# Patient Record
Sex: Female | Born: 1955
Health system: Southern US, Community
[De-identification: ages and names within clinical notes are randomized; demographics above are authoritative.]

---

## 2000-05-09 ENCOUNTER — Encounter: Payer: Self-pay | Admitting: Internal Medicine

## 2000-05-09 ENCOUNTER — Encounter: Admission: RE | Admit: 2000-05-09 | Discharge: 2000-05-09 | Payer: Self-pay | Admitting: Internal Medicine

## 2003-12-09 ENCOUNTER — Other Ambulatory Visit: Admission: RE | Admit: 2003-12-09 | Discharge: 2003-12-09 | Payer: Self-pay | Admitting: Dermatology

## 2005-08-01 ENCOUNTER — Ambulatory Visit: Payer: Self-pay | Admitting: Internal Medicine

## 2005-08-22 ENCOUNTER — Ambulatory Visit: Payer: Self-pay | Admitting: Internal Medicine

## 2005-08-22 LAB — CONVERTED CEMR LAB
ALT: 15 units/L
AST: 19 units/L
Albumin: 4.5 g/dL
Alkaline Phosphatase: 96 units/L
Bilirubin, Direct: 0.2 mg/dL
Indirect Bilirubin: 0.2 mg/dL
Total Bilirubin: 0.4 mg/dL
Total Protein: 7.8 g/dL

## 2005-08-23 LAB — CONVERTED CEMR LAB: Pap Smear: NORMAL

## 2005-09-11 ENCOUNTER — Ambulatory Visit: Payer: Self-pay | Admitting: Internal Medicine

## 2005-09-11 LAB — CONVERTED CEMR LAB
Cholesterol: 229 mg/dL
HDL: 47 mg/dL
LDL Cholesterol: 126 mg/dL
Total CHOL/HDL Ratio: 4.9
Triglycerides: 281 mg/dL
VLDL: 56 mg/dL

## 2006-03-08 ENCOUNTER — Ambulatory Visit: Payer: Self-pay | Admitting: Internal Medicine

## 2006-04-19 ENCOUNTER — Ambulatory Visit: Payer: Self-pay | Admitting: Internal Medicine

## 2006-05-28 ENCOUNTER — Encounter: Payer: Self-pay | Admitting: Internal Medicine

## 2006-05-28 DIAGNOSIS — N952 Postmenopausal atrophic vaginitis: Secondary | ICD-10-CM | POA: Insufficient documentation

## 2006-05-28 DIAGNOSIS — M1990 Unspecified osteoarthritis, unspecified site: Secondary | ICD-10-CM | POA: Insufficient documentation

## 2006-05-28 DIAGNOSIS — J309 Allergic rhinitis, unspecified: Secondary | ICD-10-CM | POA: Insufficient documentation

## 2006-05-28 DIAGNOSIS — K589 Irritable bowel syndrome without diarrhea: Secondary | ICD-10-CM | POA: Insufficient documentation

## 2006-05-28 DIAGNOSIS — K5900 Constipation, unspecified: Secondary | ICD-10-CM | POA: Insufficient documentation

## 2006-05-28 DIAGNOSIS — N6019 Diffuse cystic mastopathy of unspecified breast: Secondary | ICD-10-CM | POA: Insufficient documentation

## 2006-05-28 DIAGNOSIS — F411 Generalized anxiety disorder: Secondary | ICD-10-CM | POA: Insufficient documentation

## 2006-08-14 ENCOUNTER — Ambulatory Visit: Payer: Self-pay | Admitting: Internal Medicine

## 2006-08-14 DIAGNOSIS — M542 Cervicalgia: Secondary | ICD-10-CM | POA: Insufficient documentation

## 2006-08-14 DIAGNOSIS — E785 Hyperlipidemia, unspecified: Secondary | ICD-10-CM | POA: Insufficient documentation

## 2006-08-14 DIAGNOSIS — L259 Unspecified contact dermatitis, unspecified cause: Secondary | ICD-10-CM | POA: Insufficient documentation

## 2006-08-19 LAB — CONVERTED CEMR LAB
ALT: 12 units/L (ref 0–35)
AST: 15 units/L (ref 0–37)
Albumin: 4.7 g/dL (ref 3.5–5.2)
Alkaline Phosphatase: 111 units/L (ref 39–117)
BUN: 12 mg/dL (ref 6–23)
CO2: 19 meq/L (ref 19–32)
Calcium: 9.7 mg/dL (ref 8.4–10.5)
Chloride: 106 meq/L (ref 96–112)
Cholesterol: 264 mg/dL — ABNORMAL HIGH (ref 0–200)
Creatinine, Ser: 0.79 mg/dL (ref 0.40–1.20)
Glucose, Bld: 101 mg/dL — ABNORMAL HIGH (ref 70–99)
HDL: 47 mg/dL (ref >39)
LDL Cholesterol: 162 mg/dL — ABNORMAL HIGH (ref 0–99)
Potassium: 4.3 meq/L (ref 3.5–5.3)
Sodium: 142 meq/L (ref 135–145)
Total Bilirubin: 0.3 mg/dL (ref 0.3–1.2)
Total CHOL/HDL Ratio: 5.6
Total Protein: 7.9 g/dL (ref 6.0–8.3)
Triglycerides: 277 mg/dL — ABNORMAL HIGH (ref <150)
VLDL: 55 mg/dL — ABNORMAL HIGH (ref 0–40)

## 2006-08-27 ENCOUNTER — Telehealth (INDEPENDENT_AMBULATORY_CARE_PROVIDER_SITE_OTHER): Payer: Self-pay | Admitting: *Deleted

## 2006-08-27 ENCOUNTER — Encounter (INDEPENDENT_AMBULATORY_CARE_PROVIDER_SITE_OTHER): Payer: Self-pay | Admitting: *Deleted

## 2006-09-26 ENCOUNTER — Telehealth (INDEPENDENT_AMBULATORY_CARE_PROVIDER_SITE_OTHER): Payer: Self-pay | Admitting: *Deleted

## 2006-11-19 ENCOUNTER — Ambulatory Visit: Payer: Self-pay | Admitting: Internal Medicine

## 2007-01-10 ENCOUNTER — Telehealth (INDEPENDENT_AMBULATORY_CARE_PROVIDER_SITE_OTHER): Payer: Self-pay | Admitting: Internal Medicine

## 2007-01-21 ENCOUNTER — Ambulatory Visit: Payer: Self-pay | Admitting: Internal Medicine

## 2007-03-07 ENCOUNTER — Ambulatory Visit: Payer: Self-pay | Admitting: Internal Medicine

## 2007-03-10 ENCOUNTER — Telehealth (INDEPENDENT_AMBULATORY_CARE_PROVIDER_SITE_OTHER): Payer: Self-pay | Admitting: *Deleted

## 2007-03-10 LAB — CONVERTED CEMR LAB
ALT: 17 units/L (ref 0–35)
AST: 15 units/L (ref 0–37)
Albumin: 4.4 g/dL (ref 3.5–5.2)
Alkaline Phosphatase: 108 units/L (ref 39–117)
Bilirubin, Direct: 0.1 mg/dL (ref 0.0–0.3)
Indirect Bilirubin: 0.3 mg/dL (ref 0.0–0.9)
Total Bilirubin: 0.4 mg/dL (ref 0.3–1.2)
Total Protein: 7.3 g/dL (ref 6.0–8.3)

## 2007-05-19 ENCOUNTER — Encounter (INDEPENDENT_AMBULATORY_CARE_PROVIDER_SITE_OTHER): Payer: Self-pay | Admitting: Internal Medicine

## 2007-05-20 ENCOUNTER — Telehealth (INDEPENDENT_AMBULATORY_CARE_PROVIDER_SITE_OTHER): Payer: Self-pay | Admitting: Internal Medicine

## 2007-05-20 LAB — CONVERTED CEMR LAB
ALT: 11 units/L (ref 0–35)
AST: 11 units/L (ref 0–37)
Albumin: 4.5 g/dL (ref 3.5–5.2)
Alkaline Phosphatase: 108 units/L (ref 39–117)
Bilirubin, Direct: <0.1 mg/dL (ref 0.0–0.3)
Cholesterol: 258 mg/dL — ABNORMAL HIGH (ref 0–200)
HDL: 54 mg/dL (ref >39)
LDL Cholesterol: 149 mg/dL — ABNORMAL HIGH (ref 0–99)
Total Bilirubin: 0.3 mg/dL (ref 0.3–1.2)
Total CHOL/HDL Ratio: 4.8
Total Protein: 7.5 g/dL (ref 6.0–8.3)
Triglycerides: 274 mg/dL — ABNORMAL HIGH (ref <150)
VLDL: 55 mg/dL — ABNORMAL HIGH (ref 0–40)

## 2007-05-26 ENCOUNTER — Telehealth (INDEPENDENT_AMBULATORY_CARE_PROVIDER_SITE_OTHER): Payer: Self-pay | Admitting: Internal Medicine

## 2007-06-13 ENCOUNTER — Telehealth (INDEPENDENT_AMBULATORY_CARE_PROVIDER_SITE_OTHER): Payer: Self-pay | Admitting: *Deleted

## 2007-07-10 ENCOUNTER — Telehealth (INDEPENDENT_AMBULATORY_CARE_PROVIDER_SITE_OTHER): Payer: Self-pay | Admitting: *Deleted

## 2007-07-15 ENCOUNTER — Ambulatory Visit: Payer: Self-pay | Admitting: Internal Medicine

## 2007-07-15 DIAGNOSIS — M25519 Pain in unspecified shoulder: Secondary | ICD-10-CM | POA: Insufficient documentation

## 2007-07-16 ENCOUNTER — Encounter (INDEPENDENT_AMBULATORY_CARE_PROVIDER_SITE_OTHER): Payer: Self-pay | Admitting: Internal Medicine

## 2007-12-12 ENCOUNTER — Telehealth (INDEPENDENT_AMBULATORY_CARE_PROVIDER_SITE_OTHER): Payer: Self-pay | Admitting: Internal Medicine

## 2007-12-15 ENCOUNTER — Telehealth (INDEPENDENT_AMBULATORY_CARE_PROVIDER_SITE_OTHER): Payer: Self-pay | Admitting: *Deleted

## 2007-12-16 ENCOUNTER — Encounter (INDEPENDENT_AMBULATORY_CARE_PROVIDER_SITE_OTHER): Payer: Self-pay | Admitting: Internal Medicine

## 2007-12-19 ENCOUNTER — Encounter (INDEPENDENT_AMBULATORY_CARE_PROVIDER_SITE_OTHER): Payer: Self-pay | Admitting: Internal Medicine

## 2007-12-26 ENCOUNTER — Encounter (INDEPENDENT_AMBULATORY_CARE_PROVIDER_SITE_OTHER): Payer: Self-pay | Admitting: Internal Medicine

## 2015-01-13 ENCOUNTER — Other Ambulatory Visit (HOSPITAL_COMMUNITY): Payer: Self-pay | Admitting: Pulmonary Disease

## 2015-01-13 DIAGNOSIS — Z780 Asymptomatic menopausal state: Secondary | ICD-10-CM

## 2015-01-18 ENCOUNTER — Other Ambulatory Visit (HOSPITAL_COMMUNITY): Payer: Self-pay

## 2016-01-30 DIAGNOSIS — J309 Allergic rhinitis, unspecified: Secondary | ICD-10-CM | POA: Diagnosis not present

## 2016-01-30 DIAGNOSIS — E785 Hyperlipidemia, unspecified: Secondary | ICD-10-CM | POA: Diagnosis not present

## 2016-01-30 DIAGNOSIS — F419 Anxiety disorder, unspecified: Secondary | ICD-10-CM | POA: Diagnosis not present

## 2016-07-30 DIAGNOSIS — M1990 Unspecified osteoarthritis, unspecified site: Secondary | ICD-10-CM | POA: Diagnosis not present

## 2016-07-30 DIAGNOSIS — J309 Allergic rhinitis, unspecified: Secondary | ICD-10-CM | POA: Diagnosis not present

## 2016-07-30 DIAGNOSIS — E785 Hyperlipidemia, unspecified: Secondary | ICD-10-CM | POA: Diagnosis not present

## 2016-07-30 DIAGNOSIS — E669 Obesity, unspecified: Secondary | ICD-10-CM | POA: Diagnosis not present

## 2016-07-30 DIAGNOSIS — F419 Anxiety disorder, unspecified: Secondary | ICD-10-CM | POA: Diagnosis not present

## 2017-01-14 DIAGNOSIS — E669 Obesity, unspecified: Secondary | ICD-10-CM | POA: Diagnosis not present

## 2017-01-14 DIAGNOSIS — E785 Hyperlipidemia, unspecified: Secondary | ICD-10-CM | POA: Diagnosis not present

## 2017-01-14 DIAGNOSIS — J301 Allergic rhinitis due to pollen: Secondary | ICD-10-CM | POA: Diagnosis not present

## 2017-01-14 DIAGNOSIS — F17200 Nicotine dependence, unspecified, uncomplicated: Secondary | ICD-10-CM | POA: Diagnosis not present

## 2017-08-06 ENCOUNTER — Other Ambulatory Visit (HOSPITAL_COMMUNITY): Payer: Self-pay | Admitting: Pulmonary Disease

## 2017-08-06 DIAGNOSIS — Z780 Asymptomatic menopausal state: Secondary | ICD-10-CM

## 2017-08-06 DIAGNOSIS — Z0000 Encounter for general adult medical examination without abnormal findings: Secondary | ICD-10-CM | POA: Diagnosis not present

## 2017-08-15 ENCOUNTER — Other Ambulatory Visit (HOSPITAL_COMMUNITY): Payer: Self-pay

## 2017-08-22 ENCOUNTER — Ambulatory Visit (HOSPITAL_COMMUNITY)
Admission: RE | Admit: 2017-08-22 | Discharge: 2017-08-22 | Disposition: A | Discharge status: Home / Self Care | Payer: BLUE CROSS/BLUE SHIELD | Source: Ambulatory Visit | Attending: Pulmonary Disease | Admitting: Pulmonary Disease

## 2017-08-22 DIAGNOSIS — R739 Hyperglycemia, unspecified: Secondary | ICD-10-CM | POA: Diagnosis not present

## 2017-08-22 DIAGNOSIS — E669 Obesity, unspecified: Secondary | ICD-10-CM | POA: Diagnosis not present

## 2017-08-22 DIAGNOSIS — M8588 Other specified disorders of bone density and structure, other site: Secondary | ICD-10-CM | POA: Diagnosis not present

## 2017-08-22 DIAGNOSIS — N951 Menopausal and female climacteric states: Secondary | ICD-10-CM | POA: Diagnosis not present

## 2017-08-22 DIAGNOSIS — Z780 Asymptomatic menopausal state: Secondary | ICD-10-CM | POA: Diagnosis not present

## 2017-08-22 DIAGNOSIS — E785 Hyperlipidemia, unspecified: Secondary | ICD-10-CM | POA: Diagnosis not present

## 2017-08-22 DIAGNOSIS — M1990 Unspecified osteoarthritis, unspecified site: Secondary | ICD-10-CM | POA: Diagnosis not present

## 2017-08-22 DIAGNOSIS — M8589 Other specified disorders of bone density and structure, multiple sites: Secondary | ICD-10-CM | POA: Diagnosis not present

## 2017-08-22 DIAGNOSIS — Z0000 Encounter for general adult medical examination without abnormal findings: Secondary | ICD-10-CM | POA: Diagnosis not present

## 2017-08-22 DIAGNOSIS — F17200 Nicotine dependence, unspecified, uncomplicated: Secondary | ICD-10-CM | POA: Diagnosis not present

## 2017-11-28 DIAGNOSIS — H43391 Other vitreous opacities, right eye: Secondary | ICD-10-CM | POA: Diagnosis not present

## 2018-01-13 DIAGNOSIS — F419 Anxiety disorder, unspecified: Secondary | ICD-10-CM | POA: Diagnosis not present

## 2018-01-13 DIAGNOSIS — R739 Hyperglycemia, unspecified: Secondary | ICD-10-CM | POA: Diagnosis not present

## 2018-01-13 DIAGNOSIS — J301 Allergic rhinitis due to pollen: Secondary | ICD-10-CM | POA: Diagnosis not present

## 2018-08-07 DIAGNOSIS — Z23 Encounter for immunization: Secondary | ICD-10-CM | POA: Diagnosis not present

## 2018-08-07 DIAGNOSIS — Z0000 Encounter for general adult medical examination without abnormal findings: Secondary | ICD-10-CM | POA: Diagnosis not present

## 2019-07-29 DIAGNOSIS — M542 Cervicalgia: Secondary | ICD-10-CM | POA: Diagnosis not present

## 2019-07-29 DIAGNOSIS — E782 Mixed hyperlipidemia: Secondary | ICD-10-CM | POA: Diagnosis not present

## 2019-07-29 DIAGNOSIS — Z0189 Encounter for other specified special examinations: Secondary | ICD-10-CM | POA: Diagnosis not present

## 2019-07-29 DIAGNOSIS — E559 Vitamin D deficiency, unspecified: Secondary | ICD-10-CM | POA: Diagnosis not present

## 2019-08-03 DIAGNOSIS — Z0001 Encounter for general adult medical examination with abnormal findings: Secondary | ICD-10-CM | POA: Diagnosis not present

## 2019-08-03 DIAGNOSIS — D72829 Elevated white blood cell count, unspecified: Secondary | ICD-10-CM | POA: Diagnosis not present

## 2019-08-03 DIAGNOSIS — M542 Cervicalgia: Secondary | ICD-10-CM | POA: Diagnosis not present

## 2019-08-03 DIAGNOSIS — E559 Vitamin D deficiency, unspecified: Secondary | ICD-10-CM | POA: Diagnosis not present

## 2019-08-03 DIAGNOSIS — E782 Mixed hyperlipidemia: Secondary | ICD-10-CM | POA: Diagnosis not present

## 2019-12-02 DIAGNOSIS — E559 Vitamin D deficiency, unspecified: Secondary | ICD-10-CM | POA: Diagnosis not present

## 2019-12-02 DIAGNOSIS — D72829 Elevated white blood cell count, unspecified: Secondary | ICD-10-CM | POA: Diagnosis not present

## 2019-12-02 DIAGNOSIS — B379 Candidiasis, unspecified: Secondary | ICD-10-CM | POA: Diagnosis not present

## 2019-12-02 DIAGNOSIS — E782 Mixed hyperlipidemia: Secondary | ICD-10-CM | POA: Diagnosis not present

## 2020-05-27 DIAGNOSIS — F17210 Nicotine dependence, cigarettes, uncomplicated: Secondary | ICD-10-CM | POA: Diagnosis not present

## 2020-05-27 DIAGNOSIS — E559 Vitamin D deficiency, unspecified: Secondary | ICD-10-CM | POA: Diagnosis not present

## 2020-08-18 DIAGNOSIS — E782 Mixed hyperlipidemia: Secondary | ICD-10-CM | POA: Diagnosis not present

## 2020-08-18 DIAGNOSIS — E559 Vitamin D deficiency, unspecified: Secondary | ICD-10-CM | POA: Diagnosis not present

## 2020-08-18 DIAGNOSIS — D72829 Elevated white blood cell count, unspecified: Secondary | ICD-10-CM | POA: Diagnosis not present

## 2020-08-18 DIAGNOSIS — Z0189 Encounter for other specified special examinations: Secondary | ICD-10-CM | POA: Diagnosis not present

## 2020-09-01 DIAGNOSIS — B029 Zoster without complications: Secondary | ICD-10-CM | POA: Diagnosis not present

## 2020-09-01 DIAGNOSIS — M25561 Pain in right knee: Secondary | ICD-10-CM | POA: Diagnosis not present

## 2020-09-01 DIAGNOSIS — E789 Disorder of lipoprotein metabolism, unspecified: Secondary | ICD-10-CM | POA: Diagnosis not present

## 2020-09-01 DIAGNOSIS — F17210 Nicotine dependence, cigarettes, uncomplicated: Secondary | ICD-10-CM | POA: Diagnosis not present

## 2020-09-01 DIAGNOSIS — E559 Vitamin D deficiency, unspecified: Secondary | ICD-10-CM | POA: Diagnosis not present

## 2020-09-01 DIAGNOSIS — Z0001 Encounter for general adult medical examination with abnormal findings: Secondary | ICD-10-CM | POA: Diagnosis not present

## 2020-09-01 DIAGNOSIS — D72829 Elevated white blood cell count, unspecified: Secondary | ICD-10-CM | POA: Diagnosis not present

## 2020-09-01 DIAGNOSIS — B379 Candidiasis, unspecified: Secondary | ICD-10-CM | POA: Diagnosis not present

## 2020-09-01 DIAGNOSIS — E782 Mixed hyperlipidemia: Secondary | ICD-10-CM | POA: Diagnosis not present

## 2020-10-31 ENCOUNTER — Other Ambulatory Visit: Payer: Self-pay

## 2020-10-31 ENCOUNTER — Encounter: Payer: Self-pay | Admitting: Orthopedic Surgery

## 2020-10-31 ENCOUNTER — Ambulatory Visit: Payer: Medicare Other | Admitting: Orthopedic Surgery

## 2020-10-31 ENCOUNTER — Ambulatory Visit: Payer: Medicare Other

## 2020-10-31 VITALS — BP 150/94 | HR 111 | Ht 64.0 in | Wt 174.0 lb

## 2020-10-31 DIAGNOSIS — M1711 Unilateral primary osteoarthritis, right knee: Secondary | ICD-10-CM | POA: Diagnosis not present

## 2020-10-31 DIAGNOSIS — G8929 Other chronic pain: Secondary | ICD-10-CM

## 2020-10-31 DIAGNOSIS — M25561 Pain in right knee: Secondary | ICD-10-CM

## 2020-10-31 DIAGNOSIS — M1710 Unilateral primary osteoarthritis, unspecified knee: Secondary | ICD-10-CM

## 2020-10-31 NOTE — Progress Notes (Signed)
NEW PROBLEM//OFFICE VISIT  Summary assessment and plan: Nonoperative treatment, call us if the knee gets worse  Chief Complaint  Patient presents with  . Knee Pain    Right     65 year old female knee pain about a month no injury seems to have pain over the medial side of the knee started off inferior to the patella now seems to be better   Review of Systems  Musculoskeletal: Positive for joint pain.  Endo/Heme/Allergies: Positive for environmental allergies.  Psychiatric/Behavioral: The patient is nervous/anxious.   All other systems reviewed and are negative.    History reviewed. No pertinent past medical history.  History reviewed. No pertinent surgical history.  History reviewed. No pertinent family history. Social History   Tobacco Use  . Smoking status: Current Every Day Smoker  . Smokeless tobacco: Never Used    Allergies  Allergen Reactions  . Hydroxyzine Hcl     REACTION: rash  . Propoxyphene N-Acetaminophen     REACTION: Increased pain and inability to blink    No outpatient medications have been marked as taking for the 10/31/20 encounter (Office Visit) with Vickki Hearing, MD.    BP (!) 150/94   Pulse (!) 111   Ht 5\' 4"  (1.626 m)   Wt 174 lb (78.9 kg)   BMI 29.87 kg/m   Physical Exam  General appearance: Well-developed well-nourished no gross deformities  Cardiovascular normal pulse and perfusion normal color without edema  Neurologically o sensation loss or deficits or pathologic reflexes  Psychological: Awake alert and oriented x3 mood and affect normal  Skin no lacerations or ulcerations no nodularity no palpable masses, no erythema or nodularity  Musculoskeletal: Right knee skin looks normal.  Tenderness medial joint line.  No effusion.  Normal range of motion.  Ligaments stable.  Muscle tone normal.     MEDICAL DECISION MAKING  A.  Encounter Diagnoses  Name Primary?  . Chronic pain of right knee   . Primary localized  osteoarthritis of knee Yes    B. DATA ANALYSED:   IMAGING: Interpretation of images: X-rays in the office show arthritis medial compartment moderate to severe no varus deformity as of yet  Orders: None  Outside records reviewed: Negative   C. MANAGEMENT   No treatment at this time keep our number to call if knee gets worse  No orders of the defined types were placed in this encounter.     [place], MD  10/31/2020 4:18 PM

## 2020-10-31 NOTE — Patient Instructions (Signed)
Call us if the knee worsens

## 2021-05-31 DIAGNOSIS — R000 Tachycardia, unspecified: Secondary | ICD-10-CM | POA: Diagnosis not present

## 2021-09-07 DIAGNOSIS — E782 Mixed hyperlipidemia: Secondary | ICD-10-CM | POA: Diagnosis not present

## 2021-09-07 DIAGNOSIS — E559 Vitamin D deficiency, unspecified: Secondary | ICD-10-CM | POA: Diagnosis not present

## 2021-09-14 DIAGNOSIS — E782 Mixed hyperlipidemia: Secondary | ICD-10-CM | POA: Diagnosis not present

## 2021-09-14 DIAGNOSIS — D72829 Elevated white blood cell count, unspecified: Secondary | ICD-10-CM | POA: Diagnosis not present

## 2021-09-14 DIAGNOSIS — E559 Vitamin D deficiency, unspecified: Secondary | ICD-10-CM | POA: Diagnosis not present

## 2021-09-14 DIAGNOSIS — F17200 Nicotine dependence, unspecified, uncomplicated: Secondary | ICD-10-CM | POA: Diagnosis not present

## 2021-09-14 DIAGNOSIS — M542 Cervicalgia: Secondary | ICD-10-CM | POA: Diagnosis not present

## 2021-11-02 ENCOUNTER — Ambulatory Visit (INDEPENDENT_AMBULATORY_CARE_PROVIDER_SITE_OTHER): Payer: Medicare Other

## 2021-11-02 ENCOUNTER — Ambulatory Visit: Payer: Medicare Other | Admitting: Orthopedic Surgery

## 2021-11-02 DIAGNOSIS — M1711 Unilateral primary osteoarthritis, right knee: Secondary | ICD-10-CM

## 2021-11-02 DIAGNOSIS — G8929 Other chronic pain: Secondary | ICD-10-CM

## 2021-11-02 DIAGNOSIS — M25561 Pain in right knee: Secondary | ICD-10-CM | POA: Diagnosis not present

## 2021-11-02 NOTE — Progress Notes (Signed)
FOLLOW UP   Encounter Diagnosis  Name Primary?   Chronic pain of right knee Yes     Chief Complaint  Patient presents with   Knee Pain    RT/ comes and goes/ today feels pretty good/other days has trouble ambulating    The patient presents back after 1 year with intermittent knee pain.  But she has modified her activities.  She says she walks gingerly, turns corners gingerly and uses a cart to walk in the shopping areas.  Her sister is with her today and says she will drive next-door to her house to get from one house to the next.  She comes in with pain over the medial knee primarily right over the proximal tibia  Focused examination of the knee shows that she has tenderness over the proximal medial tibia but she has full range of motion.  It is difficult to assess whether this tenderness is joint line or tibial metaphysis.  Pes tendons mild tenderness mild swelling.  Medial femoral condyle nontender.  Lateral side nontender.  Ligaments stable.  Muscle strength and muscle tone normal.  Skin normal.  I would like to take some new x-rays and compared to the x-rays we got last year to compare at that time my notes indicate she had arthritis of the medial compartment moderate to severe with no varus deformity.  Today's x-rays worsening arthritis medial compartment still no varus deformity yet.  No significant osteophytes.  Just joint space narrowing worse than last year  Recommend injection first and then perhaps try hyaluronic acid if this does not work  Patient will give it 2 weeks and then if she is feeling good with good and then if not we will pre-CERT her for hyaluronic acid injection.  Procedure note right knee injection   verbal consent was obtained to inject right knee joint  Timeout was completed to confirm the site of injection  The medications used were depomedrol 40 mg and 1% lidocaine 3 cc Anesthesia was provided by ethyl chloride and the skin was prepped with  alcohol.  After cleaning the skin with alcohol a 20-gauge needle was used to inject the right knee joint. There were no complications. A sterile bandage was applied.

## 2021-11-02 NOTE — Patient Instructions (Signed)

## 2021-11-20 ENCOUNTER — Encounter: Payer: Self-pay | Admitting: Orthopedic Surgery

## 2021-12-14 DIAGNOSIS — R030 Elevated blood-pressure reading, without diagnosis of hypertension: Secondary | ICD-10-CM | POA: Diagnosis not present

## 2022-03-14 DIAGNOSIS — E559 Vitamin D deficiency, unspecified: Secondary | ICD-10-CM | POA: Diagnosis not present

## 2022-03-14 DIAGNOSIS — Z136 Encounter for screening for cardiovascular disorders: Secondary | ICD-10-CM | POA: Diagnosis not present

## 2022-03-14 DIAGNOSIS — Z0000 Encounter for general adult medical examination without abnormal findings: Secondary | ICD-10-CM | POA: Diagnosis not present

## 2022-03-21 DIAGNOSIS — Z0001 Encounter for general adult medical examination with abnormal findings: Secondary | ICD-10-CM | POA: Diagnosis not present

## 2022-03-21 DIAGNOSIS — G720 Drug-induced myopathy: Secondary | ICD-10-CM | POA: Diagnosis not present

## 2022-03-21 DIAGNOSIS — H9209 Otalgia, unspecified ear: Secondary | ICD-10-CM | POA: Diagnosis not present

## 2022-03-21 DIAGNOSIS — D72829 Elevated white blood cell count, unspecified: Secondary | ICD-10-CM | POA: Diagnosis not present

## 2022-03-21 DIAGNOSIS — F17200 Nicotine dependence, unspecified, uncomplicated: Secondary | ICD-10-CM | POA: Diagnosis not present

## 2022-03-21 DIAGNOSIS — E559 Vitamin D deficiency, unspecified: Secondary | ICD-10-CM | POA: Diagnosis not present

## 2022-03-21 DIAGNOSIS — Z0000 Encounter for general adult medical examination without abnormal findings: Secondary | ICD-10-CM | POA: Diagnosis not present

## 2022-08-09 ENCOUNTER — Encounter: Payer: Self-pay | Admitting: Radiology

## 2022-09-24 DIAGNOSIS — E782 Mixed hyperlipidemia: Secondary | ICD-10-CM | POA: Diagnosis not present

## 2022-09-24 DIAGNOSIS — Z139 Encounter for screening, unspecified: Secondary | ICD-10-CM | POA: Diagnosis not present

## 2022-09-24 DIAGNOSIS — E559 Vitamin D deficiency, unspecified: Secondary | ICD-10-CM | POA: Diagnosis not present

## 2022-09-28 DIAGNOSIS — Z0000 Encounter for general adult medical examination without abnormal findings: Secondary | ICD-10-CM | POA: Diagnosis not present

## 2022-09-28 DIAGNOSIS — F17210 Nicotine dependence, cigarettes, uncomplicated: Secondary | ICD-10-CM | POA: Diagnosis not present

## 2022-09-28 DIAGNOSIS — Z0001 Encounter for general adult medical examination with abnormal findings: Secondary | ICD-10-CM | POA: Diagnosis not present

## 2022-09-28 DIAGNOSIS — R7303 Prediabetes: Secondary | ICD-10-CM | POA: Diagnosis not present

## 2022-09-28 DIAGNOSIS — Z139 Encounter for screening, unspecified: Secondary | ICD-10-CM | POA: Diagnosis not present

## 2022-09-28 DIAGNOSIS — E782 Mixed hyperlipidemia: Secondary | ICD-10-CM | POA: Diagnosis not present

## 2022-09-28 DIAGNOSIS — Z79899 Other long term (current) drug therapy: Secondary | ICD-10-CM | POA: Diagnosis not present

## 2022-09-28 DIAGNOSIS — E559 Vitamin D deficiency, unspecified: Secondary | ICD-10-CM | POA: Diagnosis not present

## 2022-09-28 DIAGNOSIS — H9209 Otalgia, unspecified ear: Secondary | ICD-10-CM | POA: Diagnosis not present

## 2022-09-28 DIAGNOSIS — D72829 Elevated white blood cell count, unspecified: Secondary | ICD-10-CM | POA: Diagnosis not present

## 2022-09-28 DIAGNOSIS — G720 Drug-induced myopathy: Secondary | ICD-10-CM | POA: Diagnosis not present

## 2022-10-20 DIAGNOSIS — B070 Plantar wart: Secondary | ICD-10-CM | POA: Diagnosis not present

## 2023-04-08 DIAGNOSIS — E559 Vitamin D deficiency, unspecified: Secondary | ICD-10-CM | POA: Diagnosis not present

## 2023-04-08 DIAGNOSIS — R7303 Prediabetes: Secondary | ICD-10-CM | POA: Diagnosis not present

## 2023-04-08 DIAGNOSIS — E782 Mixed hyperlipidemia: Secondary | ICD-10-CM | POA: Diagnosis not present

## 2023-05-06 DIAGNOSIS — E559 Vitamin D deficiency, unspecified: Secondary | ICD-10-CM | POA: Diagnosis not present

## 2023-05-06 DIAGNOSIS — D72829 Elevated white blood cell count, unspecified: Secondary | ICD-10-CM | POA: Diagnosis not present

## 2023-05-06 DIAGNOSIS — G720 Drug-induced myopathy: Secondary | ICD-10-CM | POA: Diagnosis not present

## 2023-05-06 DIAGNOSIS — F17210 Nicotine dependence, cigarettes, uncomplicated: Secondary | ICD-10-CM | POA: Diagnosis not present

## 2023-05-06 DIAGNOSIS — E782 Mixed hyperlipidemia: Secondary | ICD-10-CM | POA: Diagnosis not present

## 2023-05-06 DIAGNOSIS — R7303 Prediabetes: Secondary | ICD-10-CM | POA: Diagnosis not present

## 2023-05-06 DIAGNOSIS — Z0001 Encounter for general adult medical examination with abnormal findings: Secondary | ICD-10-CM | POA: Diagnosis not present

## 2023-05-06 DIAGNOSIS — Z0000 Encounter for general adult medical examination without abnormal findings: Secondary | ICD-10-CM | POA: Diagnosis not present

## 2023-05-06 DIAGNOSIS — H9209 Otalgia, unspecified ear: Secondary | ICD-10-CM | POA: Diagnosis not present

## 2023-08-27 DIAGNOSIS — H524 Presbyopia: Secondary | ICD-10-CM | POA: Diagnosis not present

## 2023-08-27 DIAGNOSIS — H43393 Other vitreous opacities, bilateral: Secondary | ICD-10-CM | POA: Diagnosis not present

## 2023-08-27 DIAGNOSIS — H25013 Cortical age-related cataract, bilateral: Secondary | ICD-10-CM | POA: Diagnosis not present

## 2023-08-27 DIAGNOSIS — H2513 Age-related nuclear cataract, bilateral: Secondary | ICD-10-CM | POA: Diagnosis not present

## 2023-10-30 DIAGNOSIS — E559 Vitamin D deficiency, unspecified: Secondary | ICD-10-CM | POA: Diagnosis not present

## 2023-10-30 DIAGNOSIS — R7303 Prediabetes: Secondary | ICD-10-CM | POA: Diagnosis not present

## 2023-10-30 DIAGNOSIS — E782 Mixed hyperlipidemia: Secondary | ICD-10-CM | POA: Diagnosis not present

## 2023-11-06 DIAGNOSIS — H9209 Otalgia, unspecified ear: Secondary | ICD-10-CM | POA: Diagnosis not present

## 2023-11-06 DIAGNOSIS — Z0000 Encounter for general adult medical examination without abnormal findings: Secondary | ICD-10-CM | POA: Diagnosis not present

## 2023-11-06 DIAGNOSIS — E782 Mixed hyperlipidemia: Secondary | ICD-10-CM | POA: Diagnosis not present

## 2023-11-06 DIAGNOSIS — E669 Obesity, unspecified: Secondary | ICD-10-CM | POA: Diagnosis not present

## 2023-11-06 DIAGNOSIS — F17200 Nicotine dependence, unspecified, uncomplicated: Secondary | ICD-10-CM | POA: Diagnosis not present

## 2023-11-06 DIAGNOSIS — F410 Panic disorder [episodic paroxysmal anxiety] without agoraphobia: Secondary | ICD-10-CM | POA: Diagnosis not present

## 2023-11-06 DIAGNOSIS — G720 Drug-induced myopathy: Secondary | ICD-10-CM | POA: Diagnosis not present

## 2023-11-06 DIAGNOSIS — E559 Vitamin D deficiency, unspecified: Secondary | ICD-10-CM | POA: Diagnosis not present

## 2023-11-06 DIAGNOSIS — D72829 Elevated white blood cell count, unspecified: Secondary | ICD-10-CM | POA: Diagnosis not present

## 2023-11-06 DIAGNOSIS — R7303 Prediabetes: Secondary | ICD-10-CM | POA: Diagnosis not present

## 2023-12-05 DIAGNOSIS — H25813 Combined forms of age-related cataract, bilateral: Secondary | ICD-10-CM | POA: Diagnosis not present

## 2023-12-05 DIAGNOSIS — H02831 Dermatochalasis of right upper eyelid: Secondary | ICD-10-CM | POA: Diagnosis not present

## 2023-12-05 DIAGNOSIS — H01001 Unspecified blepharitis right upper eyelid: Secondary | ICD-10-CM | POA: Diagnosis not present

## 2023-12-05 DIAGNOSIS — H02834 Dermatochalasis of left upper eyelid: Secondary | ICD-10-CM | POA: Diagnosis not present

## 2024-01-13 DIAGNOSIS — H25811 Combined forms of age-related cataract, right eye: Secondary | ICD-10-CM | POA: Diagnosis not present

## 2024-01-20 ENCOUNTER — Encounter (HOSPITAL_COMMUNITY)
Admission: RE | Admit: 2024-01-20 | Discharge: 2024-01-20 | Disposition: A | Discharge status: Home / Self Care | Source: Ambulatory Visit | Attending: Ophthalmology | Admitting: Ophthalmology

## 2024-01-20 ENCOUNTER — Encounter (HOSPITAL_COMMUNITY): Payer: Self-pay

## 2024-01-22 NOTE — H&P (Signed)
 Surgical History & Physical  Patient Name: Carolyn Fernandez  DOB: April 13, 1956  Surgery: Cataract extraction with intraocular lens implant phacoemulsification; Right Eye Surgeon: Lynwood Hermann MD Surgery Date: 01/24/2024 Pre-Op Date: 12/05/2023  HPI: A 45 Yr. old female patient 1. The patient is here for a Cataract Evaluation. The patient complains of difficulty when reading fine print, books, newspaper, instructions etc., which began 8 months ago. Both eyes are affected. The episode is constant. The patient describes foggy symptoms affecting their eyes/vision. This is negatively affecting the patient's quality of life and the patient is unable to function adequately in life with the current level of vision. HPI Completed by Dr. Lynwood Hermann  Medical History: Dry Eyes Cataracts  Arthritis  Review of Systems Musculoskeletal ARTHRITIS All recorded systems are negative except as noted above.  Social Current every day smoker  Medication Prednisolone-moxiflox-bromfen,  Lorazepam  Sx/Procedures None  Drug Allergies  PENCILLINS,  Percocet , Crestor, Hydroxyzine, Claritin, Mucinex, Penicillin   History & Physical: Heent: cataracts NECK: supple without bruits LUNGS: lungs clear to auscultation CV: regular rate and rhythm Abdomen: soft and non-tender  Impression & Plan: Assessment: 1.  COMBINED FORMS AGE RELATED CATARACT; Both Eyes (H25.813) 2.  DERMATOCHALASIS, no surgery; Right Upper Lid, Left Upper Lid (H02.831, H02.834) 3.  BLEPHARITIS; Right Upper Lid, Right Lower Lid, Left Upper Lid, Left Lower Lid (H01.001, H01.002,H01.004,H01.005)  Plan: 1.  Cataract accounts for the patient's decreased vision. This visual impairment is not correctable with a tolerable change in glasses or contact lenses. Cataract surgery with an implantation of a new lens should significantly improve the visual and functional status of the patient.Discussed all risks, benefits, alternatives, and potential  complications. Discussed the procedures and recovery. Patient desires to have surgery. A-scan ordered and performed today for intra-ocular lens calculations. The surgery will be performed in order to improve vision for driving, reading, and for eye examinations. Recommend phacoemulsification with intra-ocular lens. Recommend Dextenza  for post-operative pain and inflammation. History of refractive Surgery: None Use of Eye Pressure Lowering Drops: None Right eye only Dilates well - shugarcaine or Lidocaine +Omidira by protocol Standard IOL.  2.  Incidental finding. Asymptomatic, observation for now. Findings, prognosis and treatment options reviewed.  3.  Blepharitis is present - recommend regular lid cleaning.

## 2024-01-23 NOTE — Anesthesia Preprocedure Evaluation (Addendum)
 Anesthesia Evaluation  Patient identified by MRN, date of birth, ID band Patient awake    Reviewed: Allergy & Precautions, H&P , NPO status , Patient's Chart, lab work & pertinent test results, reviewed documented beta blocker date and time   Airway Mallampati: II  TM Distance: >3 FB Neck ROM: full    Dental no notable dental hx. (+) Dental Advisory Given, Teeth Intact   Pulmonary Current Smoker   Pulmonary exam normal breath sounds clear to auscultation       Cardiovascular Exercise Tolerance: Good negative cardio ROS Normal cardiovascular exam Rhythm:regular Rate:Normal     Neuro/Psych   Anxiety     negative neurological ROS     GI/Hepatic Neg liver ROS,,,IBS   Endo/Other  negative endocrine ROS    Renal/GU negative Renal ROS  negative genitourinary   Musculoskeletal  (+) Arthritis , Osteoarthritis,    Abdominal   Peds  Hematology negative hematology ROS (+)   Anesthesia Other Findings   Reproductive/Obstetrics negative OB ROS                              Anesthesia Physical Anesthesia Plan  ASA: 2  Anesthesia Plan: MAC   Post-op Pain Management: Minimal or no pain anticipated   Induction:   PONV Risk Score and Plan:   Airway Management Planned: Nasal Cannula and Natural Airway  Additional Equipment: None  Intra-op Plan:   Post-operative Plan:   Informed Consent: I have reviewed the patients History and Physical, chart, labs and discussed the procedure including the risks, benefits and alternatives for the proposed anesthesia with the patient or authorized representative who has indicated his/her understanding and acceptance.     Dental Advisory Given  Plan Discussed with: CRNA  Anesthesia Plan Comments:          Anesthesia Quick Evaluation

## 2024-01-24 ENCOUNTER — Ambulatory Visit (HOSPITAL_COMMUNITY)
Admission: RE | Admit: 2024-01-24 | Discharge: 2024-01-24 | Disposition: A | Discharge status: Home / Self Care | Attending: Ophthalmology | Admitting: Ophthalmology

## 2024-01-24 ENCOUNTER — Other Ambulatory Visit: Payer: Self-pay

## 2024-01-24 ENCOUNTER — Encounter (HOSPITAL_COMMUNITY): Payer: Self-pay | Admitting: Ophthalmology

## 2024-01-24 ENCOUNTER — Encounter (HOSPITAL_COMMUNITY)
Admission: RE | Disposition: A | Discharge status: Home / Self Care | Payer: Self-pay | Source: Home / Self Care | Attending: Ophthalmology

## 2024-01-24 ENCOUNTER — Ambulatory Visit (HOSPITAL_COMMUNITY): Admitting: Anesthesiology

## 2024-01-24 DIAGNOSIS — F17200 Nicotine dependence, unspecified, uncomplicated: Secondary | ICD-10-CM | POA: Diagnosis not present

## 2024-01-24 DIAGNOSIS — F419 Anxiety disorder, unspecified: Secondary | ICD-10-CM

## 2024-01-24 DIAGNOSIS — H5711 Ocular pain, right eye: Secondary | ICD-10-CM | POA: Diagnosis not present

## 2024-01-24 DIAGNOSIS — H25811 Combined forms of age-related cataract, right eye: Secondary | ICD-10-CM | POA: Insufficient documentation

## 2024-01-24 HISTORY — PX: CATARACT EXTRACTION W/PHACO: SHX586

## 2024-01-24 HISTORY — PX: INSERTION, STENT, DRUG-ELUTING, LACRIMAL CANALICULUS: SHX7453

## 2024-01-24 SURGERY — PHACOEMULSIFICATION, CATARACT, WITH IOL INSERTION
Anesthesia: Monitor Anesthesia Care | Site: Eye | Laterality: Right

## 2024-01-24 MED ORDER — MIDAZOLAM HCL 2 MG/2ML IJ SOLN
INTRAMUSCULAR | Status: DC | PRN
  Administered 2024-01-24: 2 mg via INTRAVENOUS

## 2024-01-24 MED ORDER — SODIUM CHLORIDE 0.9% FLUSH
INTRAVENOUS | Status: DC | PRN
  Administered 2024-01-24: 10 mL via INTRAVENOUS

## 2024-01-24 MED ORDER — MOXIFLOXACIN HCL 5 MG/ML IO SOLN
INTRAOCULAR | Status: DC | PRN
  Administered 2024-01-24: .2 mL via INTRACAMERAL

## 2024-01-24 MED ORDER — LIDOCAINE HCL 3.5 % OP GEL
1.0000 | Freq: Once | OPHTHALMIC | Status: AC
  Administered 2024-01-24: 1 via OPHTHALMIC

## 2024-01-24 MED ORDER — SODIUM HYALURONATE 23MG/ML IO SOSY
PREFILLED_SYRINGE | INTRAOCULAR | Status: DC | PRN
  Administered 2024-01-24: .6 mL via INTRAOCULAR

## 2024-01-24 MED ORDER — SODIUM HYALURONATE 10 MG/ML IO SOLUTION
PREFILLED_SYRINGE | INTRAOCULAR | Status: DC | PRN
  Administered 2024-01-24: .85 mL via INTRAOCULAR

## 2024-01-24 MED ORDER — TETRACAINE HCL 0.5 % OP SOLN
1.0000 [drp] | OPHTHALMIC | Status: AC | PRN
  Administered 2024-01-24 (×3): 1 [drp] via OPHTHALMIC

## 2024-01-24 MED ORDER — DEXAMETHASONE 0.4 MG OP INST
VAGINAL_INSERT | OPHTHALMIC | Status: DC | PRN
  Administered 2024-01-24: .4 mg via OPHTHALMIC

## 2024-01-24 MED ORDER — LACTATED RINGERS IV SOLN: INTRAVENOUS | Status: DC

## 2024-01-24 MED ORDER — BSS IO SOLN
INTRAOCULAR | Status: DC | PRN
  Administered 2024-01-24: 15 mL via INTRAOCULAR

## 2024-01-24 MED ORDER — POVIDONE-IODINE 5 % OP SOLN
OPHTHALMIC | Status: DC | PRN
  Administered 2024-01-24: 1 via OPHTHALMIC

## 2024-01-24 MED ORDER — MIDAZOLAM HCL 2 MG/2ML IJ SOLN
INTRAMUSCULAR | Status: AC
  Filled 2024-01-24: qty 2

## 2024-01-24 MED ORDER — STERILE WATER FOR IRRIGATION IR SOLN
Status: DC | PRN
  Administered 2024-01-24: 250 mL

## 2024-01-24 MED ORDER — TROPICAMIDE 1 % OP SOLN
1.0000 [drp] | OPHTHALMIC | Status: AC | PRN
  Administered 2024-01-24 (×3): 1 [drp] via OPHTHALMIC

## 2024-01-24 MED ORDER — LIDOCAINE HCL (PF) 1 % IJ SOLN
INTRAMUSCULAR | Status: DC | PRN
  Administered 2024-01-24: 1 mL

## 2024-01-24 MED ORDER — PHENYLEPHRINE HCL 2.5 % OP SOLN
1.0000 [drp] | OPHTHALMIC | Status: AC | PRN
  Administered 2024-01-24 (×3): 1 [drp] via OPHTHALMIC

## 2024-01-24 MED ORDER — PHENYLEPHRINE-KETOROLAC 1-0.3 % IO SOLN
INTRAOCULAR | Status: DC | PRN
  Administered 2024-01-24: 500 mL via OPHTHALMIC

## 2024-01-24 MED ORDER — DEXAMETHASONE 0.4 MG OP INST
VAGINAL_INSERT | OPHTHALMIC | Status: AC
  Filled 2024-01-24: qty 1

## 2024-01-24 SURGICAL SUPPLY — 11 items
CLOTH BEACON ORANGE TIMEOUT ST (SAFETY) ×1 IMPLANT
EYE SHIELD UNIVERSAL CLEAR (GAUZE/BANDAGES/DRESSINGS) IMPLANT
FEE CATARACT SUITE SIGHTPATH (MISCELLANEOUS) ×1 IMPLANT
GLOVE BIOGEL PI IND STRL 7.0 (GLOVE) ×2 IMPLANT
LENS IOL TECNIS EYHANCE 17.0 (Intraocular Lens) IMPLANT
NDL HYPO 18GX1.5 BLUNT FILL (NEEDLE) ×1 IMPLANT
NEEDLE HYPO 18GX1.5 BLUNT FILL (NEEDLE) ×1 IMPLANT
PAD ARMBOARD POSITIONER FOAM (MISCELLANEOUS) ×1 IMPLANT
SYR TB 1ML LL NO SAFETY (SYRINGE) ×1 IMPLANT
TAPE SURG TRANSPORE 1 IN (GAUZE/BANDAGES/DRESSINGS) IMPLANT
WATER STERILE IRR 250ML POUR (IV SOLUTION) ×1 IMPLANT

## 2024-01-24 NOTE — Op Note (Signed)
 Date of procedure: 01/24/24  Pre-operative diagnosis:  Visually significant combined form age-related cataract, Right Eye (H25.811)  Post-operative diagnosis:   1. Visually significant combined form age-related cataract, Right Eye (H25.811) 2. Pain and inflammation following cataract surgery Right Eye (H57.11)  Procedure:  Removal of cataract via phacoemulsification and insertion of intra-ocular lens [employer] DIB00 +17.0D into the capsular bag of the Right Eye 2. Placement of Dextenza  insert, Right Eye  Attending surgeon: Lynwood LABOR. Emiliya Chretien, MD, MA  Anesthesia: MAC, Topical Akten   Complications: None  Estimated Blood Loss: <68mL (minimal)  Specimens: None  Implants: As above  Indications:  Visually significant age-related cataract, Right Eye  Procedure:  The patient was seen and identified in the pre-operative area. The operative eye was identified and dilated.  The operative eye was marked.  Topical anesthesia was administered to the operative eye.     The patient was then to the operative suite and placed in the supine position.  A timeout was performed confirming the patient, procedure to be performed, and all other relevant information.   The patient's face was prepped and draped in the usual fashion for intra-ocular surgery.  A lid speculum was placed into the operative eye and the surgical microscope moved into place and focused.  A superotemporal paracentesis was created using a 20 gauge paracentesis blade. Omidria  was injected into the anterior chamber. Shugarcaine was injected into the anterior chamber.  Viscoelastic was injected into the anterior chamber.  A temporal clear-corneal main wound incision was created using a 2.38mm microkeratome.  A continuous curvilinear capsulorrhexis was initiated using an irrigating cystitome and completed using capsulorrhexis forceps.  Hydrodissection and hydrodeliniation were performed.  Viscoelastic was injected into the anterior  chamber.  A phacoemulsification handpiece and a chopper as a second instrument were used to remove the nucleus and epinucleus. The irrigation/aspiration handpiece was used to remove any remaining cortical material.   The capsular bag was reinflated with viscoelastic, checked, and found to be intact.  The intraocular lens was inserted into the capsular bag.  The irrigation/aspiration handpiece was used to remove any remaining viscoelastic.  The clear corneal wound and paracentesis wounds were then hydrated and checked with Weck-Cels to be watertight. 0.1mL of moxifloxacin  was injected into the anterior chamber.  The lid-speculum was removed. The lower punctum was dilated. A Dextenza  implant was placed in the lower canaliculus without complication.  The drape was removed.  The patient's face was cleaned with a wet and dry 4x4. A clear shield was taped over the eye. The patient was taken to the post-operative care unit in good condition, having tolerated the procedure well.  Post-Op Instructions: The patient will follow up at [facility] for a same day post-operative evaluation and will receive all other orders and instructions.

## 2024-01-24 NOTE — Anesthesia Postprocedure Evaluation (Signed)
 Anesthesia Post Note  Patient: Carolyn Fernandez  Procedure(s) Performed: PHACOEMULSIFICATION, CATARACT, WITH IOL INSERTION (Right: Eye) INSERTION, STENT, DRUG-ELUTING, LACRIMAL CANALICULUS (Right: Eye)  Patient location during evaluation: Phase II Anesthesia Type: MAC Level of consciousness: awake and alert Pain management: pain level controlled Vital Signs Assessment: post-procedure vital signs reviewed and stable Respiratory status: spontaneous breathing, nonlabored ventilation and respiratory function stable Cardiovascular status: stable and blood pressure returned to baseline Postop Assessment: no apparent nausea or vomiting Anesthetic complications: no   There were no known notable events for this encounter.   Last Vitals:  Vitals:   01/24/24 0746 01/24/24 0859  BP: (!) 120/55 (!) 131/96  Pulse: (!) 101 98  Resp: (!) 22 (!) 23  Temp:  37.1 C  SpO2: 97% 99%    Last Pain:  Vitals:   01/24/24 0859  TempSrc: Oral  PainSc: 0-No pain                 Daine Gunther L Nora Rooke

## 2024-01-24 NOTE — Transfer of Care (Signed)
 Immediate Anesthesia Transfer of Care Note  Patient: Carolyn Fernandez  Procedure(s) Performed: PHACOEMULSIFICATION, CATARACT, WITH IOL INSERTION (Right: Eye) INSERTION, STENT, DRUG-ELUTING, LACRIMAL CANALICULUS (Right: Eye)  Patient Location: Short Stay  Anesthesia Type:MAC  Level of Consciousness: awake, alert , oriented, and patient cooperative  Airway & Oxygen Therapy: Patient Spontanous Breathing  Post-op Assessment: Report given to RN, Post -op Vital signs reviewed and stable, and Patient moving all extremities X 4  Post vital signs: Reviewed and stable  Last Vitals:  Vitals Value Taken Time  BP 131/96 01/24/24 08:59  Temp 37.1 C 01/24/24 08:59  Pulse 98 01/24/24 08:59  Resp 23 01/24/24 08:59  SpO2 99 % 01/24/24 08:59    Last Pain:  Vitals:   01/24/24 0859  TempSrc: Oral  PainSc: 0-No pain         Complications: No notable events documented.

## 2024-01-24 NOTE — Interval H&P Note (Signed)
 History and Physical Interval Note:  01/24/2024 8:29 AM  Carolyn Fernandez  has presented today for surgery, with the diagnosis of combined froms age related cataract, right eye.  The various methods of treatment have been discussed with the patient and family. After consideration of risks, benefits and other options for treatment, the patient has consented to  Procedure(s) with comments: PHACOEMULSIFICATION, CATARACT, WITH IOL INSERTION (Right) - CDE: INSERTION, STENT, DRUG-ELUTING, LACRIMAL CANALICULUS (Right) as a surgical intervention.  The patient's history has been reviewed, patient examined, no change in status, stable for surgery.  I have reviewed the patient's chart and labs.  Questions were answered to the patient's satisfaction.     HARRIE AGENT

## 2024-01-24 NOTE — Discharge Instructions (Addendum)
 Please discharge patient when stable, will follow up today with Dr. June Leap at the [facility] office immediately following discharge.  Leave shield in place until visit.  All paperwork with discharge instructions will be given at the office.  [facility] Address:  [address]  [place], [place] [address]

## 2024-01-27 ENCOUNTER — Encounter (HOSPITAL_COMMUNITY): Payer: Self-pay | Admitting: Ophthalmology

## 2024-03-19 DIAGNOSIS — H2512 Age-related nuclear cataract, left eye: Secondary | ICD-10-CM | POA: Diagnosis not present

## 2024-03-23 NOTE — H&P (Signed)
 Surgical History & Physical  Patient Name: Carolyn Fernandez  DOB: [birth year]  Surgery: Cataract extraction with intraocular lens implant phacoemulsification; Left Eye Surgeon: Lynwood Hermann MD Surgery Date: 03/27/2024 Pre-Op Date: 01/27/2024  HPI: A 1 Yr. old female patient 1. The patient is returning after cataract surgery. The right eye is affected. Status post cataract surgery, which began 3 days ago: Since the last visit, the affected area feels improvement. The patient's vision is improved. The condition's severity is constant. Patient is following medication instructions. Patient states she sees a slight wavy lines in od. Patient also here for blurry vision in the left eye. There is a large imbalance between the two eyes. This is negatively affecting the patient's quality of life and the patient is unable to function adequately in life with the current level of vision. HPI Completed by Dr. Lynwood Hermann  Medical History: Dry Eyes Cataracts  Arthritis  Review of Systems Musculoskeletal ARTHRITIS All recorded systems are negative except as noted above.  Social Current every day smoker  Medication Prednisolone-moxiflox-bromfen,  Lorazepam  Sx/Procedures Phaco c IOL OD-dextenza ,   Drug Allergies  PENCILLINS,  Percocet , Crestor, Hydroxyzine, Claritin, Mucinex  History & Physical: Heent: cataract NECK: supple without bruits LUNGS: lungs clear to auscultation CV: regular rate and rhythm Abdomen: soft and non-tender  Impression & Plan: Assessment: 1.  CATARACT EXTRACTION STATUS; Right Eye (Z98.41) 2.  COMBINED FORMS AGE RELATED CATARACT; Left Eye (H25.812)  Plan: 1.  3 days after cataract surgery. Doing well with improved vision and normal eye pressure. Call with any problems or concerns. Continue Pred-Moxi-Brom 3x/day for 4 days, then 2x/day for 1 more week.,  2.  Cataract accounts for the patient's decreased vision. This visual impairment is not correctable with a  tolerable change in glasses or contact lenses. Cataract surgery with an implantation of a new lens should significantly improve the visual and functional status of the patient. Recommend phacoemulsification with intraocular lens. Discussed all risks, benefits, alternatives, and potential complications. Discussed the procedures and recovery. The patient desires to have surgery. A-scan/Biometry ordered and will be performed for intraocular lens calculations. The surgery will be performed in order to improve vision for driving, reading, and for eye examinations. Recommend Dextenza  for post-operative pain and inflammation. Educational materials provided: Cataract. History of corneal refractive Surgery: none History of Previous Ocular Surgery (PPV, other): None History of ocular trauma: None Use of Eye Pressure Lowering Drops: None Pupil Status: Dilates well - shugarcaine or Lidocaine +Omidira by protocol Left Eye. Refractive Goal: [place] Surgery required to correct imbalance of vision.

## 2024-03-24 ENCOUNTER — Encounter (HOSPITAL_COMMUNITY)
Admission: RE | Admit: 2024-03-24 | Discharge: 2024-03-24 | Disposition: A | Discharge status: Home / Self Care | Source: Ambulatory Visit | Attending: Ophthalmology | Admitting: Ophthalmology

## 2024-03-27 ENCOUNTER — Ambulatory Visit (HOSPITAL_COMMUNITY): Admitting: Anesthesiology

## 2024-03-27 ENCOUNTER — Other Ambulatory Visit: Payer: Self-pay

## 2024-03-27 ENCOUNTER — Encounter (HOSPITAL_COMMUNITY): Payer: Self-pay | Admitting: Ophthalmology

## 2024-03-27 ENCOUNTER — Ambulatory Visit (HOSPITAL_COMMUNITY)
Admission: RE | Admit: 2024-03-27 | Discharge: 2024-03-27 | Disposition: A | Discharge status: Home / Self Care | Attending: Ophthalmology | Admitting: Ophthalmology

## 2024-03-27 ENCOUNTER — Encounter (HOSPITAL_COMMUNITY)
Admission: RE | Disposition: A | Discharge status: Home / Self Care | Payer: Self-pay | Source: Home / Self Care | Attending: Ophthalmology

## 2024-03-27 DIAGNOSIS — H5712 Ocular pain, left eye: Secondary | ICD-10-CM | POA: Diagnosis not present

## 2024-03-27 DIAGNOSIS — Z9841 Cataract extraction status, right eye: Secondary | ICD-10-CM | POA: Insufficient documentation

## 2024-03-27 DIAGNOSIS — H2512 Age-related nuclear cataract, left eye: Secondary | ICD-10-CM

## 2024-03-27 DIAGNOSIS — F419 Anxiety disorder, unspecified: Secondary | ICD-10-CM

## 2024-03-27 DIAGNOSIS — F17200 Nicotine dependence, unspecified, uncomplicated: Secondary | ICD-10-CM | POA: Insufficient documentation

## 2024-03-27 DIAGNOSIS — H25812 Combined forms of age-related cataract, left eye: Secondary | ICD-10-CM | POA: Diagnosis present

## 2024-03-27 HISTORY — PX: CATARACT EXTRACTION W/PHACO: SHX586

## 2024-03-27 HISTORY — PX: INSERTION, STENT, DRUG-ELUTING, LACRIMAL CANALICULUS: SHX7453

## 2024-03-27 SURGERY — PHACOEMULSIFICATION, CATARACT, WITH IOL INSERTION
Anesthesia: Monitor Anesthesia Care | Site: Eye | Laterality: Left

## 2024-03-27 MED ORDER — SODIUM CHLORIDE 0.9% FLUSH
INTRAVENOUS | Status: DC | PRN
  Administered 2024-03-27: 8 mL via INTRAVENOUS

## 2024-03-27 MED ORDER — PHENYLEPHRINE-KETOROLAC 1-0.3 % IO SOLN
INTRAOCULAR | Status: DC | PRN
  Administered 2024-03-27: 500 mL via OPHTHALMIC

## 2024-03-27 MED ORDER — STERILE WATER FOR IRRIGATION IR SOLN
Status: DC | PRN
  Administered 2024-03-27: 1

## 2024-03-27 MED ORDER — TETRACAINE HCL 0.5 % OP SOLN
1.0000 [drp] | OPHTHALMIC | Status: AC | PRN
  Administered 2024-03-27 (×3): 1 [drp] via OPHTHALMIC

## 2024-03-27 MED ORDER — BSS IO SOLN
INTRAOCULAR | Status: DC | PRN
  Administered 2024-03-27: 15 mL via INTRAOCULAR

## 2024-03-27 MED ORDER — POVIDONE-IODINE 5 % OP SOLN
OPHTHALMIC | Status: DC | PRN
  Administered 2024-03-27: 1 via OPHTHALMIC

## 2024-03-27 MED ORDER — PHENYLEPHRINE HCL 2.5 % OP SOLN
1.0000 [drp] | OPHTHALMIC | Status: AC | PRN
  Administered 2024-03-27 (×3): 1 [drp] via OPHTHALMIC

## 2024-03-27 MED ORDER — MOXIFLOXACIN HCL 5 MG/ML IO SOLN
INTRAOCULAR | Status: DC | PRN
  Administered 2024-03-27: .3 mL via INTRACAMERAL

## 2024-03-27 MED ORDER — LACTATED RINGERS IV SOLN: INTRAVENOUS | Status: DC

## 2024-03-27 MED ORDER — SODIUM HYALURONATE 23MG/ML IO SOSY
PREFILLED_SYRINGE | INTRAOCULAR | Status: DC | PRN
  Administered 2024-03-27: .6 mL via INTRAOCULAR

## 2024-03-27 MED ORDER — SODIUM HYALURONATE 10 MG/ML IO SOLUTION
PREFILLED_SYRINGE | INTRAOCULAR | Status: DC | PRN
  Administered 2024-03-27: .85 mL via INTRAOCULAR

## 2024-03-27 MED ORDER — TROPICAMIDE 1 % OP SOLN
1.0000 [drp] | OPHTHALMIC | Status: AC | PRN
  Administered 2024-03-27 (×3): 1 [drp] via OPHTHALMIC

## 2024-03-27 MED ORDER — MIDAZOLAM HCL 5 MG/5ML IJ SOLN
INTRAMUSCULAR | Status: DC | PRN
  Administered 2024-03-27: 1 mg via INTRAVENOUS

## 2024-03-27 MED ORDER — DEXAMETHASONE 0.4 MG OP INST
VAGINAL_INSERT | OPHTHALMIC | Status: DC | PRN
  Administered 2024-03-27: .4 mg via OPHTHALMIC

## 2024-03-27 MED ORDER — MIDAZOLAM HCL 2 MG/2ML IJ SOLN
INTRAMUSCULAR | Status: AC
  Filled 2024-03-27: qty 2

## 2024-03-27 MED ADMIN — Lidocaine HCl Ophth Gel 3.5%: 1 | OPHTHALMIC | NDC 82584079201

## 2024-03-27 MED ADMIN — Lidocaine HCl Local Preservative Free (PF) Inj 1%: 1 mL | NDC 00409471332

## 2024-03-27 MED FILL — Dexamethasone (Ophth) Insert 0.4 MG: OPHTHALMIC | Qty: 1 | Status: AC

## 2024-03-27 SURGICAL SUPPLY — 11 items
CLOTH BEACON ORANGE TIMEOUT ST (SAFETY) ×1 IMPLANT
EYE SHIELD UNIVERSAL CLEAR (GAUZE/BANDAGES/DRESSINGS) IMPLANT
FEE CATARACT SUITE SIGHTPATH (MISCELLANEOUS) ×1 IMPLANT
GLOVE BIOGEL PI IND STRL 7.0 (GLOVE) ×2 IMPLANT
LENS IOL TECNIS EYHANCE 17.0 (Intraocular Lens) IMPLANT
NDL HYPO 18GX1.5 BLUNT FILL (NEEDLE) ×1 IMPLANT
NEEDLE HYPO 18GX1.5 BLUNT FILL (NEEDLE) ×1 IMPLANT
PAD ARMBOARD POSITIONER FOAM (MISCELLANEOUS) ×1 IMPLANT
SYR TB 1ML LL NO SAFETY (SYRINGE) ×1 IMPLANT
TAPE SURG TRANSPORE 1 IN (GAUZE/BANDAGES/DRESSINGS) IMPLANT
WATER STERILE IRR 250ML POUR (IV SOLUTION) ×1 IMPLANT

## 2024-03-27 NOTE — Anesthesia Preprocedure Evaluation (Signed)
 Anesthesia Evaluation  Patient identified by MRN, date of birth, ID band Patient awake    Reviewed: Allergy & Precautions, H&P , NPO status , Patient's Chart, lab work & pertinent test results, reviewed documented beta blocker date and time   Airway Mallampati: II  TM Distance: >3 FB Neck ROM: full    Dental no notable dental hx. (+) Dental Advisory Given, Teeth Intact   Pulmonary Current Smoker   Pulmonary exam normal breath sounds clear to auscultation       Cardiovascular Exercise Tolerance: Good negative cardio ROS Normal cardiovascular exam Rhythm:regular Rate:Normal     Neuro/Psych   Anxiety     negative neurological ROS     GI/Hepatic Neg liver ROS,,,IBS   Endo/Other  negative endocrine ROS    Renal/GU negative Renal ROS  negative genitourinary   Musculoskeletal  (+) Arthritis , Osteoarthritis,    Abdominal   Peds  Hematology negative hematology ROS (+)   Anesthesia Other Findings   Reproductive/Obstetrics negative OB ROS                              Anesthesia Physical Anesthesia Plan  ASA: 2  Anesthesia Plan: MAC   Post-op Pain Management: Minimal or no pain anticipated   Induction:   PONV Risk Score and Plan:   Airway Management Planned: Nasal Cannula and Natural Airway  Additional Equipment: None  Intra-op Plan:   Post-operative Plan:   Informed Consent: I have reviewed the patients History and Physical, chart, labs and discussed the procedure including the risks, benefits and alternatives for the proposed anesthesia with the patient or authorized representative who has indicated his/her understanding and acceptance.     Dental Advisory Given  Plan Discussed with: CRNA  Anesthesia Plan Comments:          Anesthesia Quick Evaluation

## 2024-03-27 NOTE — Transfer of Care (Signed)
 Immediate Anesthesia Transfer of Care Note  Patient: Carolyn Fernandez  Procedure(s) Performed: PHACOEMULSIFICATION, CATARACT, WITH IOL INSERTION (Left: Eye) INSERTION, STENT, DRUG-ELUTING, LACRIMAL CANALICULUS (Left: Eye)  Patient Location: Short Stay  Anesthesia Type:General  Level of Consciousness: awake  Airway & Oxygen Therapy: Patient Spontanous Breathing  Post-op Assessment: Report given to RN  Post vital signs: Reviewed and stable  Last Vitals:  Vitals Value Taken Time  BP 141/87 03/27/24 10:21  Temp 36.9 C 03/27/24 10:21  Pulse 98 03/27/24 10:21  Resp 16 03/27/24 10:21  SpO2 98 % 03/27/24 10:21    Last Pain:  Vitals:   03/27/24 1021  TempSrc: Oral  PainSc: 0-No pain         Complications: No notable events documented.

## 2024-03-27 NOTE — Interval H&P Note (Signed)
 History and Physical Interval Note:  03/27/2024 9:53 AM  Carolyn Fernandez  has presented today for surgery, with the diagnosis of combined forms age related cataract, left eye.  The various methods of treatment have been discussed with the patient and family. After consideration of risks, benefits and other options for treatment, the patient has consented to  Procedure(s) with comments: PHACOEMULSIFICATION, CATARACT, WITH IOL INSERTION (Left) - Pt notified of new arrival time 7:15am INSERTION, STENT, DRUG-ELUTING, LACRIMAL CANALICULUS (Left) as a surgical intervention.  The patient's history has been reviewed, patient examined, no change in status, stable for surgery.  I have reviewed the patient's chart and labs.  Questions were answered to the patient's satisfaction.     HARRIE AGENT

## 2024-03-27 NOTE — Anesthesia Postprocedure Evaluation (Signed)
 Anesthesia Post Note  Patient: Carolyn Fernandez  Procedure(s) Performed: PHACOEMULSIFICATION, CATARACT, WITH IOL INSERTION (Left: Eye) INSERTION, STENT, DRUG-ELUTING, LACRIMAL CANALICULUS (Left: Eye)  Patient location during evaluation: Short Stay Anesthesia Type: MAC Level of consciousness: awake and alert Pain management: pain level controlled Vital Signs Assessment: post-procedure vital signs reviewed and stable Respiratory status: spontaneous breathing Cardiovascular status: blood pressure returned to baseline and stable Postop Assessment: no apparent nausea or vomiting Anesthetic complications: no   No notable events documented.   Last Vitals:  Vitals:   03/27/24 0918 03/27/24 1021  BP: (P) 109/76 (!) 141/87  Pulse:  98  Resp:  16  Temp:  36.9 C  SpO2:  98%    Last Pain:  Vitals:   03/27/24 1021  TempSrc: Oral  PainSc: 0-No pain                 Eyoel Throgmorton

## 2024-03-27 NOTE — Op Note (Signed)
 Date of procedure: 03/27/24  Pre-operative diagnosis:  Visually significant age-related nuclear cataract, Left Eye (H25.12)  Post-operative diagnosis:   1. Visually significant age-related nuclear cataract, Left Eye (H25.12) 2. Pain and inflammation following cataract surgery Left Eye (H57.12)  Procedure:  Removal of cataract via phacoemulsification and insertion of intra-ocular lens [employer] DIB00 +17.0D into the capsular bag of the Left Eye 2. Placement of Dextenza  insert, Left Eye  Attending surgeon: Lynwood LABOR. Adaleah Forget, MD, MA  Anesthesia: MAC, Topical Akten   Complications: None  Estimated Blood Loss: <28mL (minimal)  Specimens: None  Implants: As above  Indications:  Visually significant age-related cataract, Left Eye  Procedure:  The patient was seen and identified in the pre-operative area. The operative eye was identified and dilated.  The operative eye was marked.  Topical anesthesia was administered to the operative eye.     The patient was then to the operative suite and placed in the supine position.  A timeout was performed confirming the patient, procedure to be performed, and all other relevant information.   The patient's face was prepped and draped in the usual fashion for intra-ocular surgery.  A lid speculum was placed into the operative eye and the surgical microscope moved into place and focused.  An inferotemporal paracentesis was created using a 20 gauge paracentesis blade. Omidria  was injected into the anterior chamber. Shugarcaine was injected into the anterior chamber.  Viscoelastic was injected into the anterior chamber.  A temporal clear-corneal main wound incision was created using a 2.55mm microkeratome.  A continuous curvilinear capsulorrhexis was initiated using an irrigating cystitome and completed using capsulorrhexis forceps.  Hydrodissection and hydrodeliniation were performed.  Viscoelastic was injected into the anterior chamber.  A  phacoemulsification handpiece and a chopper as a second instrument were used to remove the nucleus and epinucleus. The irrigation/aspiration handpiece was used to remove any remaining cortical material.   The capsular bag was reinflated with viscoelastic, checked, and found to be intact.  The intraocular lens was inserted into the capsular bag.  The irrigation/aspiration handpiece was used to remove any remaining viscoelastic.  The clear corneal wound and paracentesis wounds were then hydrated and checked with Weck-Cels to be watertight. 0.1mL of moxifloxacin  was injected into the anterior chamber.  The lid-speculum was removed. The lower punctum was dilated. A Dextenza  implant was placed in the lower canaliculus without complication.  The drape was removed.  The patient's face was cleaned with a wet and dry 4x4. A clear shield was taped over the eye. The patient was taken to the post-operative care unit in good condition, having tolerated the procedure well.  Post-Op Instructions: The patient will follow up at [facility] for a same day post-operative evaluation and will receive all other orders and instructions.

## 2024-03-27 NOTE — Discharge Instructions (Addendum)
 Please discharge patient when stable, will follow up today with Dr. June Leap at the [facility] office immediately following discharge.  Leave shield in place until visit.  All paperwork with discharge instructions will be given at the office.  [facility] Address:  [address]  [place], [place] [address]
